# Patient Record
Sex: Female | Born: 1975 | Race: White | Hispanic: No | Marital: Married | Smoking: Never smoker
Health system: Southern US, Community
[De-identification: ages and names within clinical notes are randomized; demographics above are authoritative.]

## PROBLEM LIST (undated history)

## (undated) DIAGNOSIS — R002 Palpitations: Secondary | ICD-10-CM

## (undated) DIAGNOSIS — G43109 Migraine with aura, not intractable, without status migrainosus: Secondary | ICD-10-CM

## (undated) HISTORY — PX: NO PAST SURGERIES: SHX2092

---

## 2002-12-03 ENCOUNTER — Other Ambulatory Visit: Payer: Self-pay

## 2003-07-17 ENCOUNTER — Other Ambulatory Visit: Payer: Self-pay

## 2003-12-03 ENCOUNTER — Ambulatory Visit: Payer: Self-pay | Admitting: Anesthesiology

## 2004-06-20 ENCOUNTER — Ambulatory Visit (HOSPITAL_COMMUNITY): Admission: RE | Admit: 2004-06-20 | Discharge: 2004-06-20 | Payer: Self-pay | Admitting: Gynecology

## 2004-08-08 ENCOUNTER — Ambulatory Visit (HOSPITAL_COMMUNITY): Admission: RE | Admit: 2004-08-08 | Discharge: 2004-08-08 | Payer: Self-pay | Admitting: Gynecology

## 2004-10-10 ENCOUNTER — Ambulatory Visit (HOSPITAL_COMMUNITY): Admission: RE | Admit: 2004-10-10 | Discharge: 2004-10-10 | Payer: Self-pay | Admitting: Gynecology

## 2004-10-30 ENCOUNTER — Inpatient Hospital Stay (HOSPITAL_COMMUNITY): Admission: AD | Admit: 2004-10-30 | Discharge: 2004-11-01 | Payer: Self-pay | Admitting: Obstetrics & Gynecology

## 2005-05-04 ENCOUNTER — Emergency Department: Payer: Self-pay | Admitting: Emergency Medicine

## 2005-10-14 IMAGING — US US OB FOLLOW-UP
1 series · 13 of 28 positions shown · non-contrast
Comparison: none

CLINICAL DATA: 29-year-old.  G1 P0 with scan for growth.  Bilateral clubbed feet.

[Series 1: us ob follow-up · 0.33mm/px · 13 of 66 slices shown]
[im 3/66]
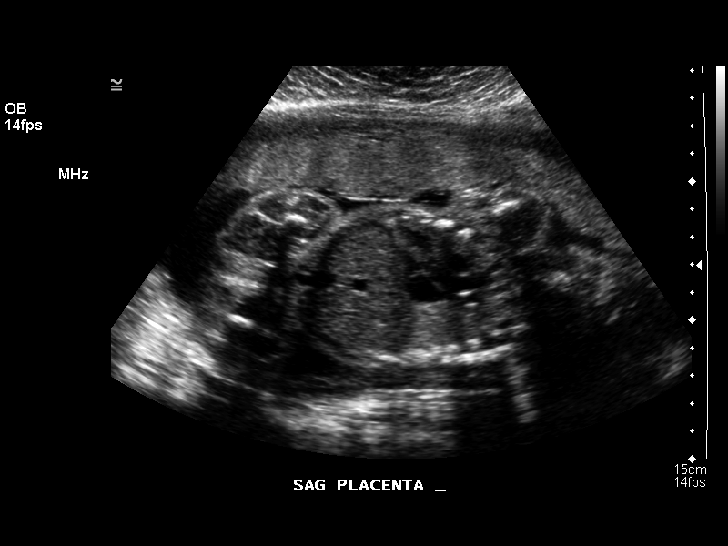
[im 8/66]
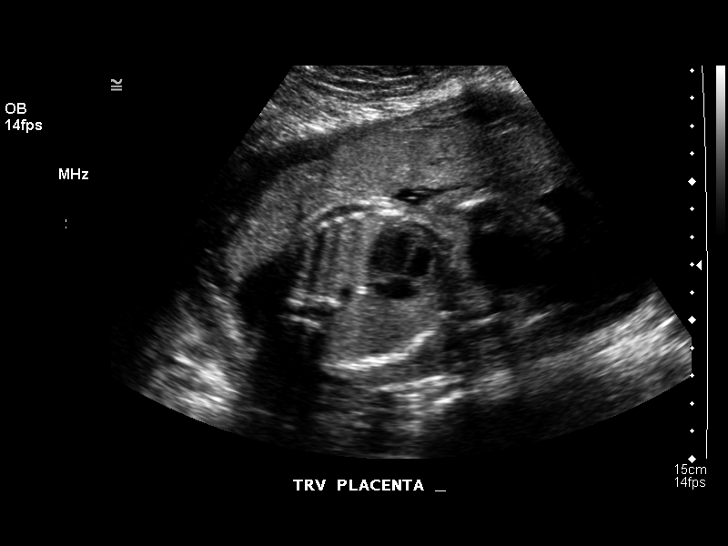
[im 13/66]
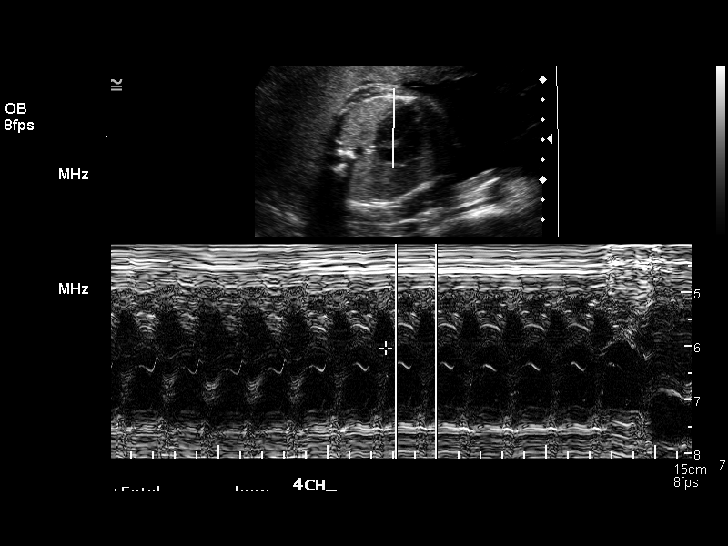
[im 17/66]
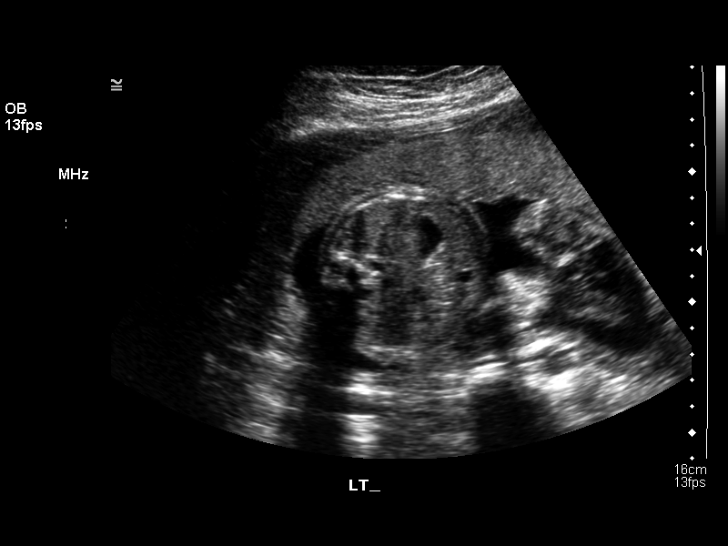
[im 22/66]
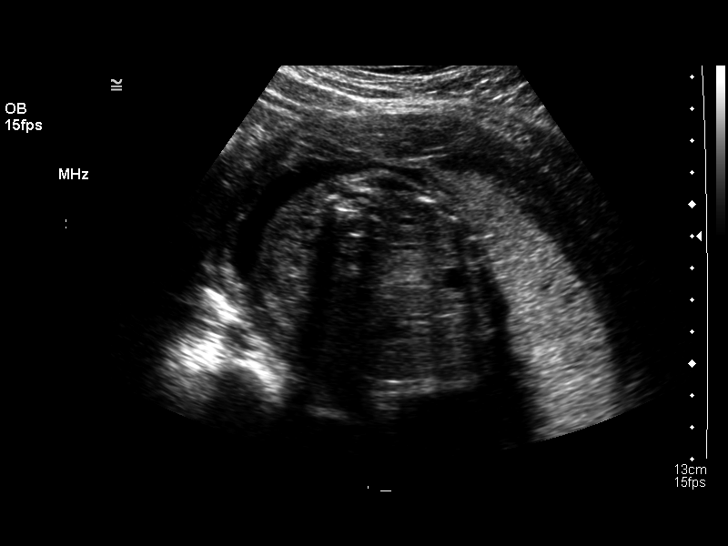
[im 27/66]
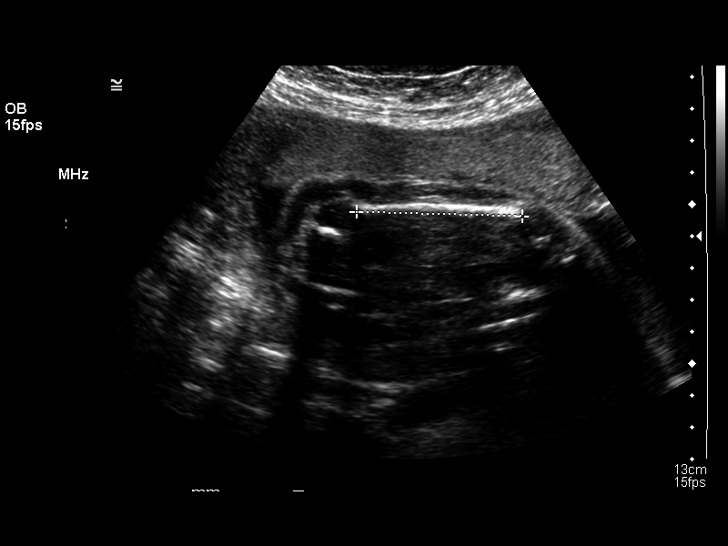
[im 34/66]
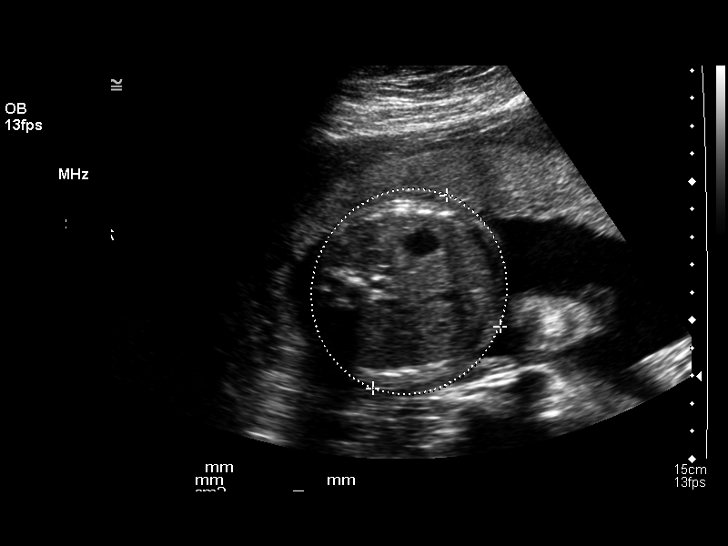
[im 39/66]
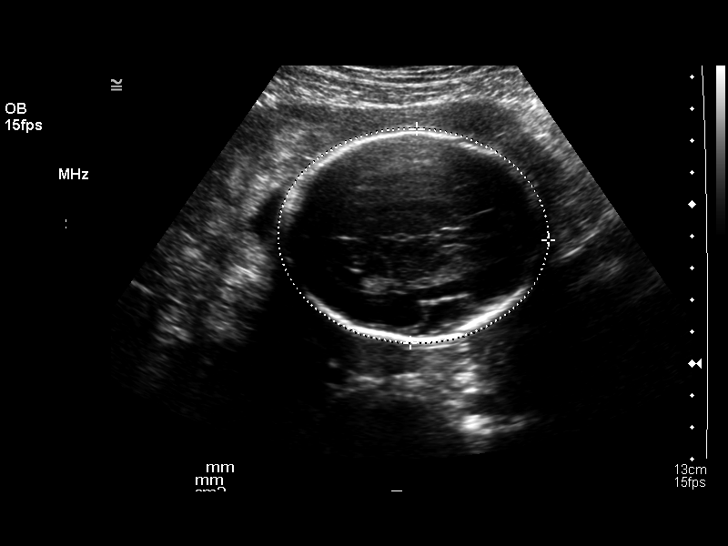
[im 44/66]
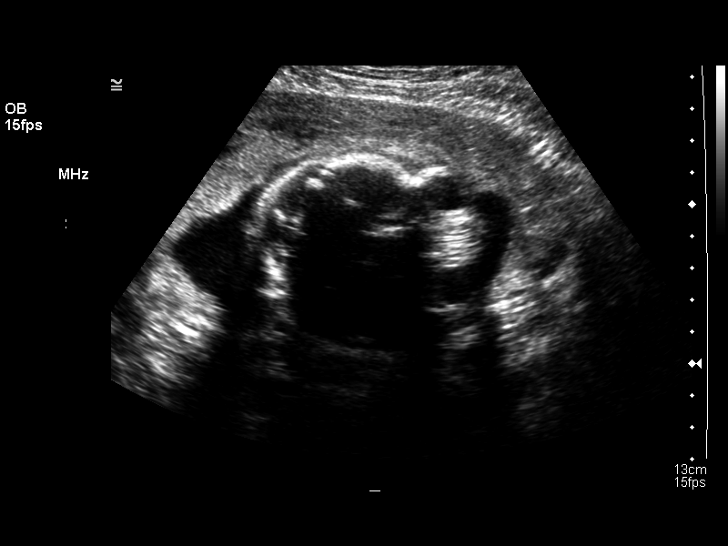
[im 49/66]
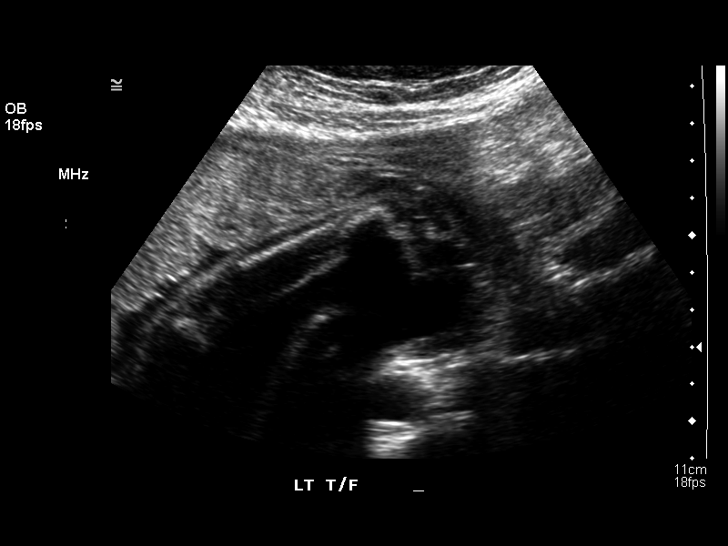
[im 53/66]
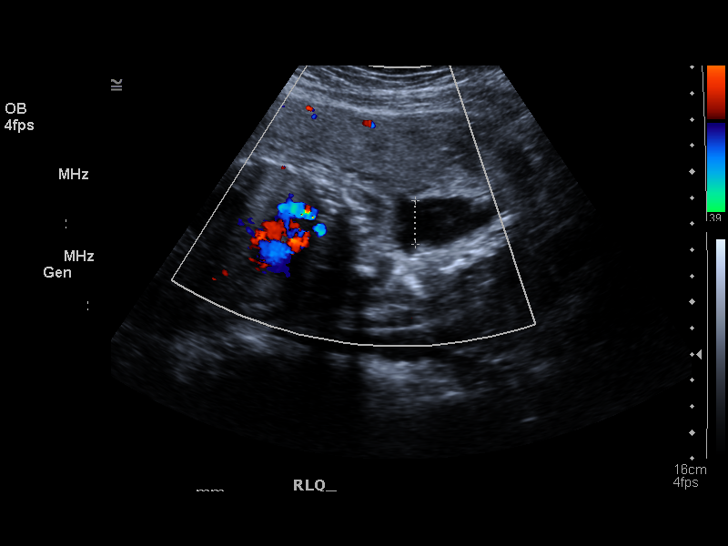
[im 58/66]
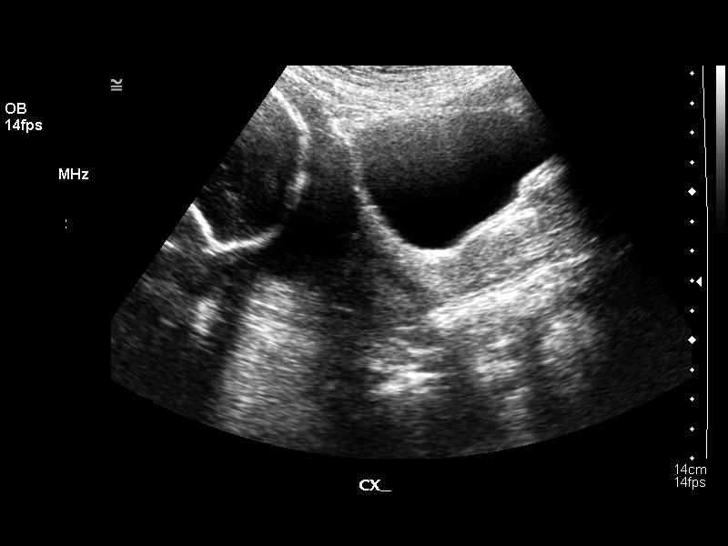
[im 63/66]
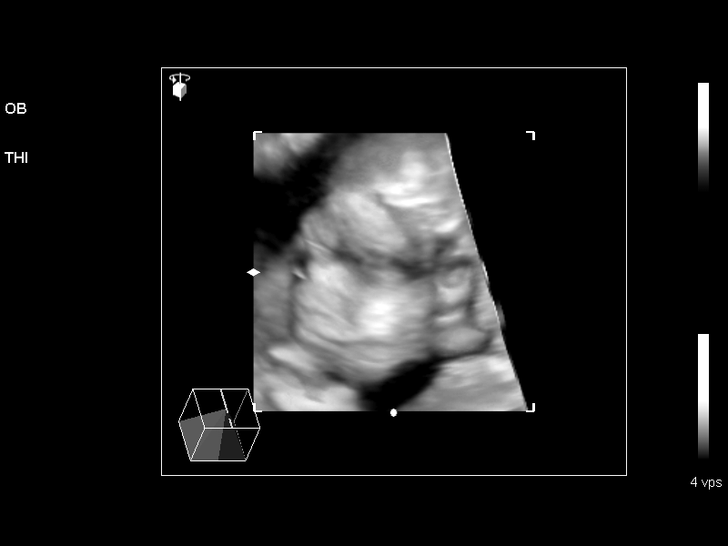

[13 of 28 positions shown; findings below may reference images not displayed]

OBSTETRICAL ULTRASOUND RE-EVALUATION:
 Number of Fetuses:  1
 Heart Rate:  153
 Movement:  Yes
 Breathing:  No
 Presentation:  Cephalic
 Placental Location:  Anterior, right lateral
 Grade:  I
 Previa:  No
 Amniotic Fluid (subjective):  Normal
 Amniotic Fluid (objective):  14.4 cm AFI (5th -95th%ile = 9.5 – 22.6 cm for 27 wks)

 FETAL BIOMETRY
 BPD:  6.6 cm   26 w 3 d
 HC:  24.2 cm   26 w 2 d
 AC:  22.1 cm   26 w 3 d
 FL:   5.2 cm   27 w 4 d

 Mean GA:  26 w 5 d
 Assigned GA:  26 w 4 d

 EFW:  990 g (H) 50th – 75th%ile (875 – 8983 g) For 27 wks

 FETAL ANATOMY
 Lateral Ventricles:  Visualized 
 Thalami/CSP:  Previously seen 
 Posterior Fossa:  Previously seen 
 Nuchal Region:  Previously seen 
 Spine:  Previously seen 
 4 Chamber Heart on Left:  Visualized 
 Stomach on Left:  Visualized 
 3 Vessel Cord:  Previously seen 
 Cord Insertion Site:  Previously seen 
 Kidneys:  Visualized 
 Bladder:  Visualized 
 Extremities:  Previously seen 
 Comment:  Left club foot is again documented.  Because of fetal position, the right lower extremity was difficult to evaluate.  

 MATERNAL UTERINE AND ADNEXAL FINDINGS
 Cervix:  3.4 cm Transabdominally
IMPRESSION: 1.  Single living intrauterine fetus in cephalic presentation. Appropriate interval growth and normal fluid.  
 2.  Left club foot again documented.

## 2006-02-03 ENCOUNTER — Ambulatory Visit: Payer: Self-pay | Admitting: Obstetrics & Gynecology

## 2006-02-03 ENCOUNTER — Encounter: Payer: Self-pay | Admitting: Obstetrics & Gynecology

## 2006-03-03 ENCOUNTER — Emergency Department: Payer: Self-pay | Admitting: Emergency Medicine

## 2006-03-04 ENCOUNTER — Other Ambulatory Visit: Payer: Self-pay

## 2006-09-16 ENCOUNTER — Ambulatory Visit: Payer: Self-pay | Admitting: Obstetrics & Gynecology

## 2006-09-22 ENCOUNTER — Ambulatory Visit: Payer: Self-pay | Admitting: Gynecology

## 2006-10-13 ENCOUNTER — Ambulatory Visit (HOSPITAL_COMMUNITY): Admission: RE | Admit: 2006-10-13 | Discharge: 2006-10-13 | Payer: Self-pay | Admitting: Obstetrics & Gynecology

## 2006-10-20 ENCOUNTER — Ambulatory Visit: Payer: Self-pay | Admitting: Obstetrics & Gynecology

## 2006-11-02 ENCOUNTER — Ambulatory Visit: Payer: Self-pay | Admitting: Gynecology

## 2007-12-19 IMAGING — US US OB COMP LESS 14 WK
1 series · 14 of 28 positions shown · non-contrast
Comparison: none

OBSTETRICAL ULTRASOUND:

 This ultrasound exam was performed in the [HOSPITAL] Ultrasound Department.  The OB US report was generated in the AS system, and faxed to the ordering physician.  This report is also available in [REDACTED] PACS.

[Series 1: us ob comp less 14 wk · 0.24mm/px · 14 of 35 slices shown]
[im 2/35]
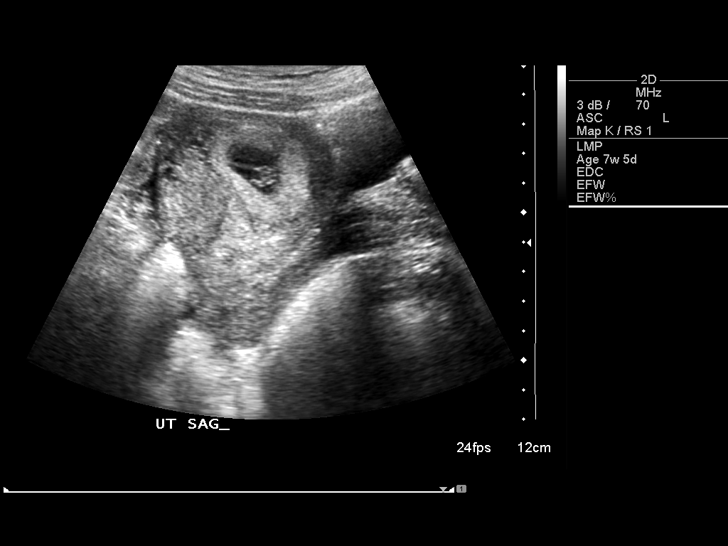
[im 4/35]
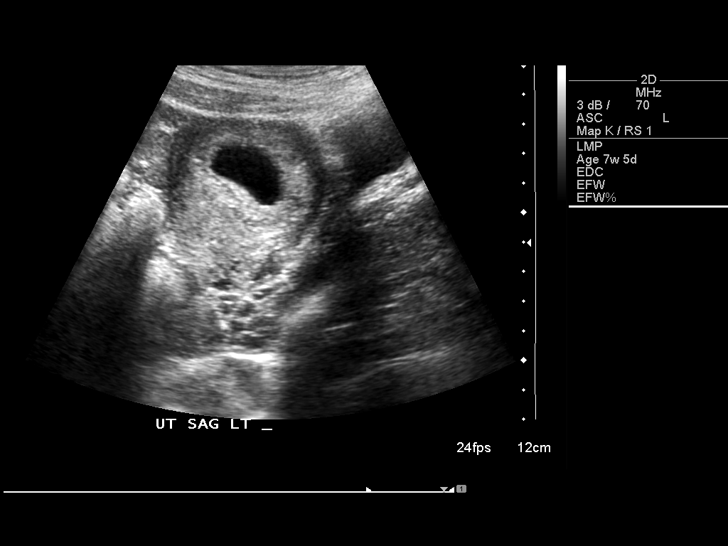
[im 7/35]
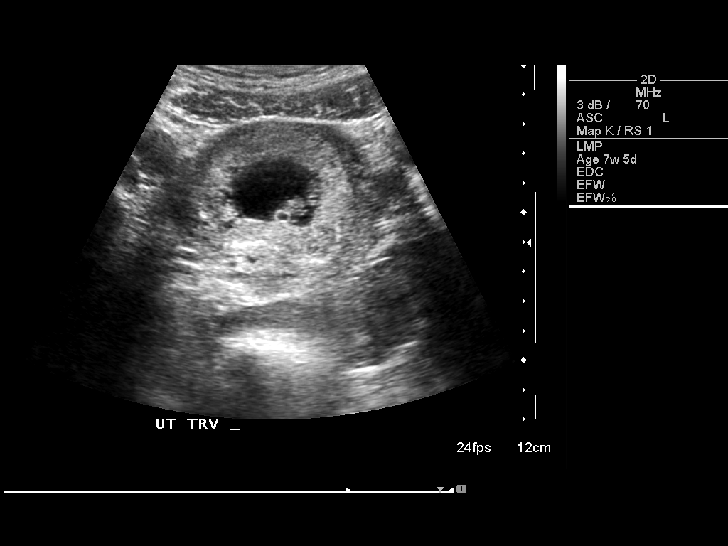
[im 9/35]
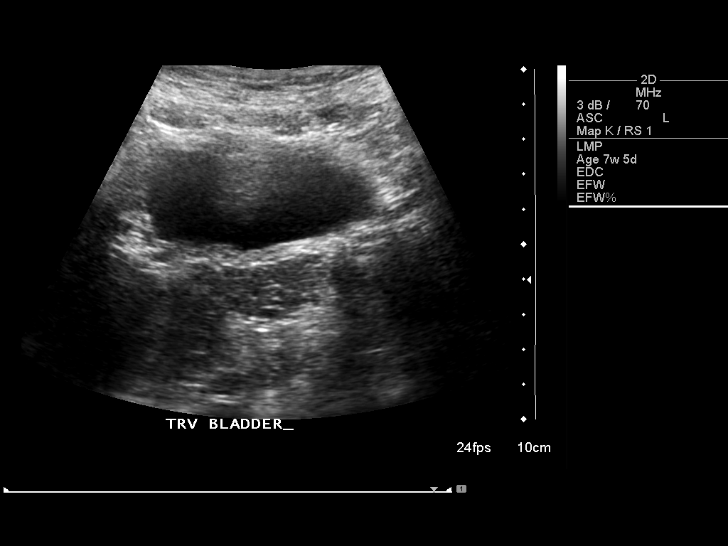
[im 12/35]
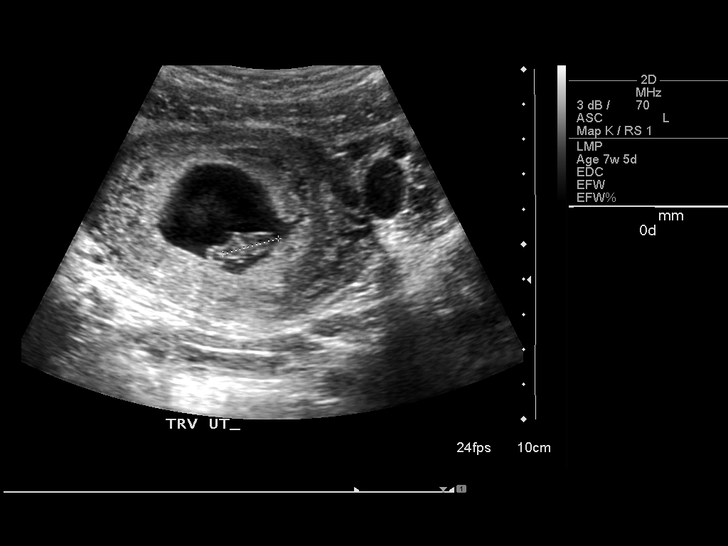
[im 14/35]
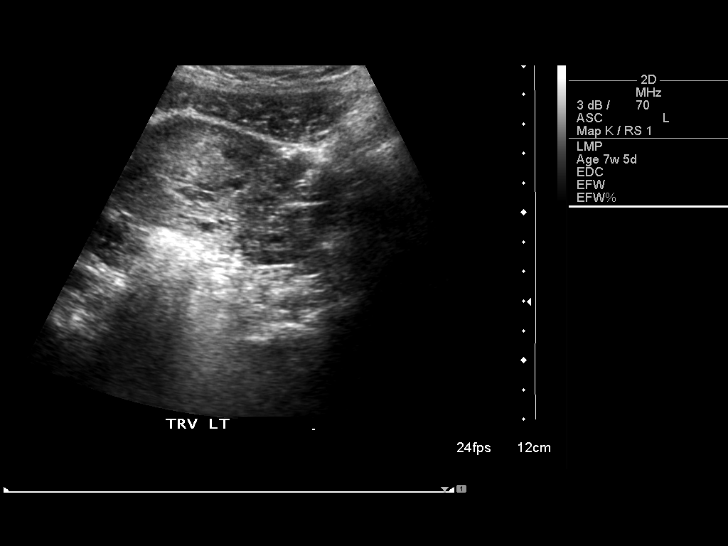
[im 17/35]
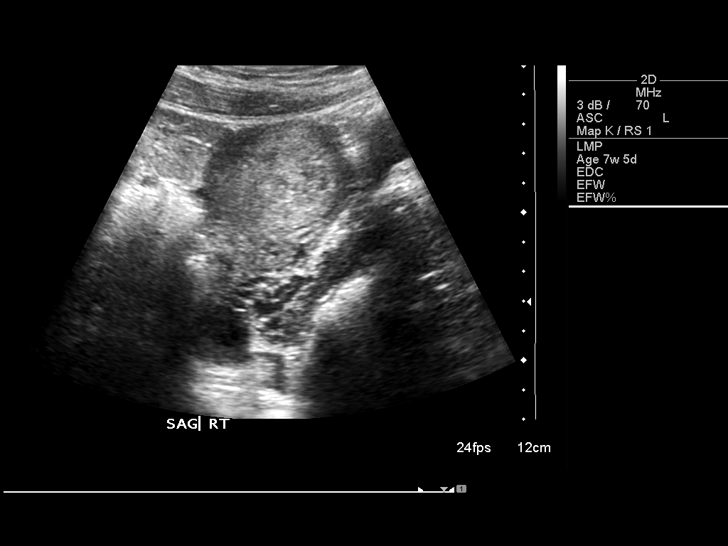
[im 19/35]
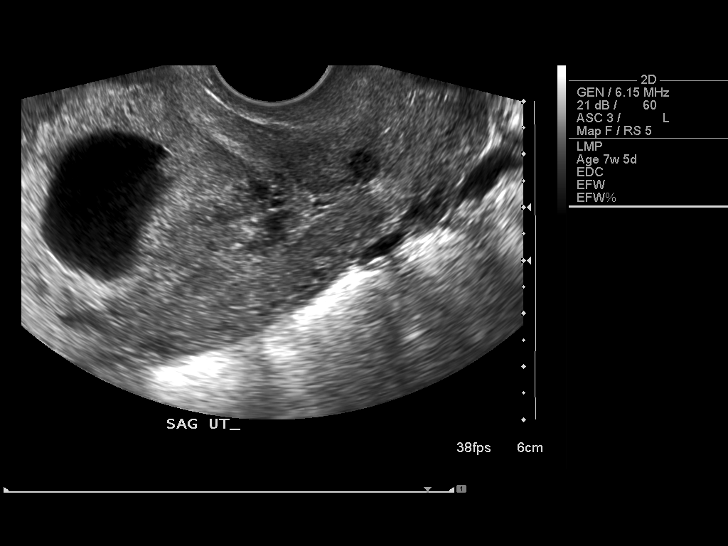
[im 22/35]
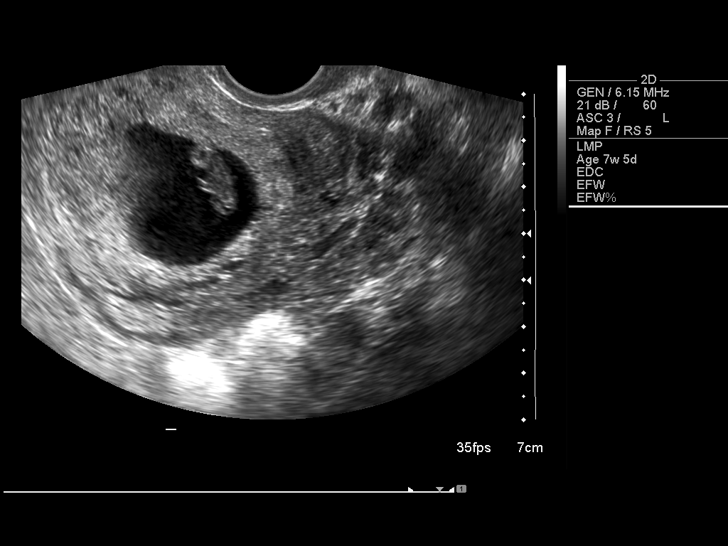
[im 24/35]
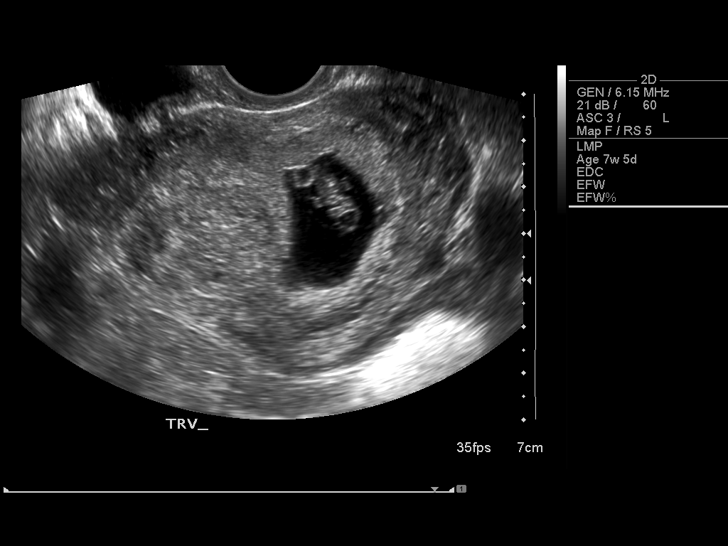
[im 27/35]
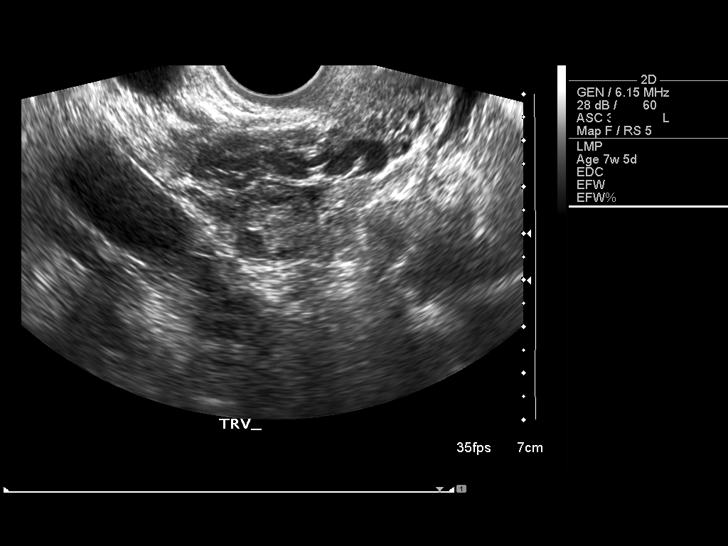
[im 29/35]
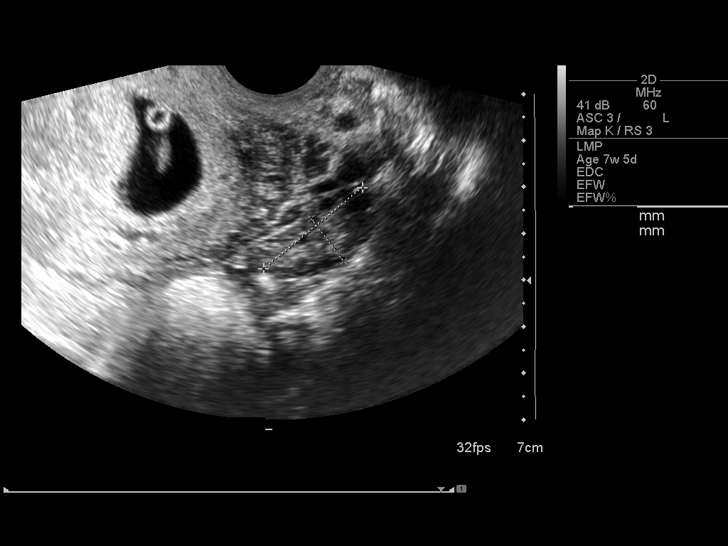
[im 32/35]
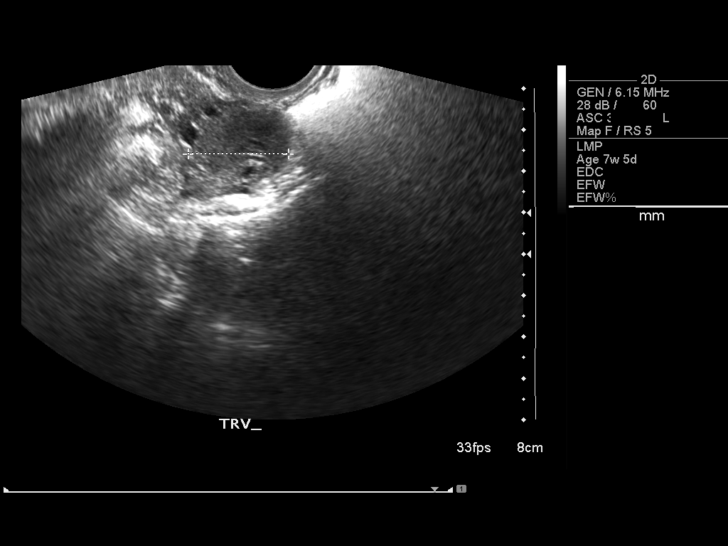
[im 35/35]
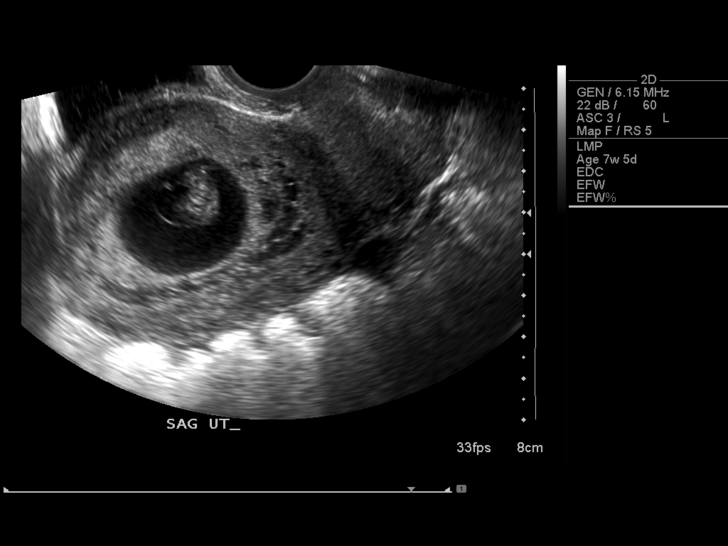

[14 of 28 positions shown; findings below may reference images not displayed]

IMPRESSION: See AS Obstetric US report.

## 2010-02-02 ENCOUNTER — Encounter: Payer: Self-pay | Admitting: Gynecology

## 2010-05-27 NOTE — Assessment & Plan Note (Signed)
[name], April Everett NO.:  [id number]   MEDICAL RECORD NO.:  [id number]         PATIENT TYPE:  POB   LOCATION:  [facility]         FACILITY:  [facility]   PHYSICIAN:  Allie Bossier, MD        DATE OF BIRTH:  07/26/1975   DATE OF SERVICE:                                  CLINIC NOTE   [facility] NOTE   HISTORY:  April Everett is a 35 year old, married, white, gravida III, para I,  abortus II, with a 2-month-old son.  She comes in for her annual exam.  She needs a pap smear today and wishes to discuss future pregnancy.  She  would like to become pregnant and has not used birth control in the last  15 months.  She is currently taking a multi-vitamin for folic acid  supplementation.  She is breast feeding two times a day and has not had  a period since her pregnancy.  The other __________ and variable with  regard to pregnancy is that she has had sex only approximately 3 times  since her delivery 15 months ago.  She does report a decrease sexual  drive.  Her other complaint today is that of a 2 year history (prior to  pregnancy) of floaters and occasional visual loss for the last two  years.  She has seen an ophthalmologist for this who has given her a  diagnosis of optic migraines.  Her other medical history is significant  for dysplastic nevi in the past.   PAST SURGICAL HISTORY:  Removal of skin lesions.   FAMILY HISTORY:  A brain tumor in a grandmother and coronary artery  disease.  She denies a family history of breast, GYN or colon  malignancy.   SOCIAL HISTORY:  She denies tobacco or hard drug use.   REVIEW OF SYSTEMS:  She has been married for three years and has  reported a new onset skin lesion of her right mons.   ALLERGIES:  No known drug allergies. No latex allergies.   PHYSICAL EXAMINATION:  Weight 126 pounds, blood pressure 119/73, pulse  73.  HEENT:  Normal.  HEART:  Regular rate and rhythm.  LUNGS: Clear to  auscultation bilaterally.   ABDOMEN:  Benign.  BREAST EXAM:  Normal.  EXTERNAL GENITALIA:  On her mons she has an approximately 7 mm raised  lesion in the right aspect of the mons.  The remainder of the vulva and  vagina are normal.  Cervix is normal.  Uterus is normal size and shape,  mobile and not enlarged.  Adnexa:  No tenderness.   ASSESSMENT AND PLAN:  1. Annual check PAP smear, encouraged self breast and self vulvar      exams monthly.  2. New onset of mons skin lesion.  We will schedule removal of this at      her convenience.  3. Amenorrhea.  Check __________ thyroid.  4. Positive family history of coronary artery disease.  Will check a      fasting lipid panel today.      Allie Bossier, MD     MCD/MEDQ  D:  02/03/2006  T:  02/03/2006  Job:  [id number] 

## 2015-11-07 ENCOUNTER — Ambulatory Visit
Admission: EM | Admit: 2015-11-07 | Discharge: 2015-11-07 | Disposition: A | Discharge status: Home / Self Care | Payer: Managed Care, Other (non HMO) | Attending: Family Medicine | Admitting: Family Medicine

## 2015-11-07 ENCOUNTER — Ambulatory Visit (INDEPENDENT_AMBULATORY_CARE_PROVIDER_SITE_OTHER): Payer: Managed Care, Other (non HMO)

## 2015-11-07 DIAGNOSIS — S92502A Displaced unspecified fracture of left lesser toe(s), initial encounter for closed fracture: Secondary | ICD-10-CM | POA: Diagnosis not present

## 2015-11-07 DIAGNOSIS — S9032XA Contusion of left foot, initial encounter: Secondary | ICD-10-CM | POA: Diagnosis not present

## 2015-11-07 NOTE — ED Provider Notes (Signed)
[facility] ____________________________________________  Time seen: Approximately 9:25 AM  I have reviewed the triage vital signs and the nursing notes.   HISTORY  Chief Complaint Foot Injury (Toes)  HPI Nikki Poirrier Tober is a 40 y.o. female presents for complaints of left foot pain since prior to arrival. States this morning she was exercising and went to put up her free weights, and in doing so she accidentally dropped a 5 pound weight on her left foot. States pain since. Reports unable to weight bear well due to pain in foot, but reports has applied some weight. Denies fall, head injury or loss of consciousness. Denies decreased sensation or pain radiation. Denies other pain.   States mild pain at this time, mostly with direct palpation or with attempted weightbearing. Patient denies any issues or previous injuries to left foot in the past. Denies any other pain or complaints.  Patient's last menstrual period was 10/18/2015 (exact date).Denies pregnancy.     History reviewed. No pertinent past medical history.  There are no active problems to display for this patient.   History reviewed. No pertinent surgical history.    No current facility-administered medications for this encounter.  No current outpatient prescriptions on file.  Allergies Erythromycin  History reviewed. No pertinent family history.  Social History Social History  Substance Use Topics  . Smoking status: Never Smoker  . Smokeless tobacco: Never Used  . Alcohol use No    Review of Systems Constitutional: No fever/chills Eyes: No visual changes. ENT: No sore throat. Cardiovascular: Denies chest pain. Respiratory: Denies shortness of breath. Gastrointestinal: No abdominal pain.  No nausea, no vomiting.  No diarrhea.  No constipation. Genitourinary: Negative for dysuria. Musculoskeletal: Negative for back pain. Skin: Negative for rash. Neurological: Negative for headaches, focal  weakness or numbness.  10-point ROS otherwise negative.  ____________________________________________   PHYSICAL EXAM:  VITAL SIGNS: ED Triage Vitals  Enc Vitals Group     BP 11/07/15 0921 118/79     Pulse Rate 11/07/15 0921 79     Resp 11/07/15 0921 18     Temp 11/07/15 0921 98 F (36.7 C)     Temp Source 11/07/15 0921 Oral     SpO2 11/07/15 0921 98 %     Weight 11/07/15 0921 130 lb (59 kg)     Height 11/07/15 0921 5\' 7"  (1.702 m)     Head Circumference --      Peak Flow --      Pain Score 11/07/15 0924 7     Pain Loc --      Pain Edu? --      Excl. in [place]? --     Constitutional: Alert and oriented. Well appearing and in no acute distress. Eyes: Conjunctivae are normal. PERRL. EOMI. ENT      Head: Normocephalic and atraumatic.      Mouth/Throat: Mucous membranes are moist. Cardiovascular: Normal rate, regular rhythm. Grossly normal heart sounds.  Good peripheral circulation. Respiratory: Normal respiratory effort without tachypnea nor retractions. Breath sounds are clear and equal bilaterally. No wheezes/rales/rhonchi.. Musculoskeletal: No midline cervical, thoracic or lumbar tenderness to palpation. Bilateral pedal pulses equal and easily palpated.  except: Left dorsal and plantar surface of foot along distal second through fourth metatarsals and proximal toes mild direct tenderness to palpation, moderate tenderness along distal third metatarsal, mild swelling and mild ecchymosis present, normal distal sensation and normal distal capillary refill, left lower extremity otherwise nontender. Gait not tested due to pain.  Neurologic:  Normal speech and language. No gross focal neurologic deficits are appreciated. Speech is normal. No gait instability.  Skin:  Skin is warm, dry and intact. No rash noted. Psychiatric: Mood and affect are normal. Speech and behavior are normal. Patient exhibits appropriate insight and judgment   ___________________________________________    LABS (all labs ordered are listed, but only abnormal results are displayed)  Labs Reviewed - No data to display ____________________________________________  RADIOLOGY  Dg Foot Complete Left  Result Date: 11/07/2015 CLINICAL DATA:  Dropped 5 pound weight onto left foot EXAM: LEFT FOOT - COMPLETE 3+ VIEW COMPARISON:  None. FINDINGS: Mildly displaced transverse fracture involving the distal shaft of the 3rd proximal phalanx. The joint spaces are preserved. Mild soft tissue swelling overlying the 5th MTP joint. IMPRESSION: Fracture involving the distal shaft of the 3rd proximal phalanx, as above. Electronically Signed   By: Julian Hy M.D.   On: 11/07/2015 10:01   ____________________________________________   PROCEDURES Procedures   Left second and third toes buddy taped, postoperative shoe applied. Crutches given. ________________________________________   INITIAL IMPRESSION / ASSESSMENT AND PLAN / ED COURSE  Pertinent labs & imaging results that were available during my care of the patient were reviewed by me and considered in my medical decision making (see chart for details).  Well appearing patient. No acute distress. Presents for complaints of left foot pain post mechanical injury. Left foot x-ray per radiologist's mildly displaced fracture involving the distal shaft of the third proximal phalanx. Discussed and reviewed x-ray with patient and family. Recommend podiatry follow-up as patient is very physically active and frequently exercises. Recommend follow-up in the next few days. Patient declines need for pain medication. Encouraged supportive care, rest, ice, elevation. Use of crutches and postoperative shoe and buddy taping. Information for podiatry given. Patient to call today to schedule follow-up.  Discussed follow up with Primary care physician this week. Discussed follow up and return parameters including no resolution or any worsening concerns. Patient verbalized  understanding and agreed to plan.   ____________________________________________   FINAL CLINICAL IMPRESSION(S) / ED DIAGNOSES  Final diagnoses:  Contusion of left foot, initial encounter  Closed fracture of phalanx of left third toe, initial encounter     There are no discharge medications for this patient.   Note: This dictation was prepared with Dragon dictation along with smaller phrase technology. Any transcriptional errors that result from this process are unintentional.    Clinical Course      Marylene Land, NP 11/07/15 1121

## 2015-11-07 NOTE — Discharge Instructions (Signed)
Take medication as prescribed. Rest. Elevate and apply ice.   Follow up with Podiatry this week.  Follow up with your primary care physician this week as needed. Return to Urgent care for new or worsening concerns.

## 2015-11-07 NOTE — ED Triage Notes (Signed)
Pt presents with a hurt foot, she was cleaning up this morning and dropped a weight 5lbs of her foot.

## 2015-11-10 ENCOUNTER — Telehealth: Payer: Self-pay | Admitting: *Deleted

## 2015-11-12 ENCOUNTER — Encounter: Payer: Self-pay | Admitting: *Deleted

## 2015-11-13 NOTE — Discharge Instructions (Signed)
[facility] °[facility] ° °POST OPERATIVE INSTRUCTIONS FOR DR. TROXLER AND DR. FOWLER °[facility] ° ° °1. Take your medication as prescribed.  Pain medication should be taken only as needed. ° °2. Keep the dressing clean, dry and intact. ° °3. Keep your foot elevated above the heart level for the first 48 hours. ° °4. Walking to the bathroom and brief periods of walking are acceptable, unless we have instructed you to be non-weight bearing. ° °5. Always wear your post-op shoe when walking.  Always use your crutches if you are to be non-weight bearing. ° °6. Do not take a shower. Baths are permissible as long as the foot is kept out of the water.  ° °7. Every hour you are awake:  °- Bend your knee 15 times. °- Flex foot 15 times °- Massage calf 15 times ° °8. Call [facility] ([phone]) if any of the following problems occur: °- You develop a temperature or fever. °- The bandage becomes saturated with blood. °- Medication does not stop your pain. °- Injury of the foot occurs. °- Any symptoms of infection including redness, odor, or red streaks running from wound. ° °General Anesthesia, Adult, Care After °Refer to this sheet in the next few weeks. These instructions provide you with information on caring for yourself after your procedure. Your health care provider may also give you more specific instructions. Your treatment has been planned according to current medical practices, but problems sometimes occur. Call your health care provider if you have any problems or questions after your procedure. °WHAT TO EXPECT AFTER THE PROCEDURE °After the procedure, it is typical to experience: °· Sleepiness. °· Nausea and vomiting. °HOME CARE INSTRUCTIONS °· For the first 24 hours after general anesthesia: °¨ Have a responsible person with you. °¨ Do not drive a car. If you are alone, do not take public transportation. °¨ Do not drink alcohol. °¨ Do not take  medicine that has not been prescribed by your health care provider. °¨ Do not sign important papers or make important decisions. °¨ You may resume a normal diet and activities as directed by your health care provider. °· Change bandages (dressings) as directed. °· If you have questions or problems that seem related to general anesthesia, call the hospital and ask for the anesthetist or anesthesiologist on call. °SEEK MEDICAL CARE IF: °· You have nausea and vomiting that continue the day after anesthesia. °· You develop a rash. °SEEK IMMEDIATE MEDICAL CARE IF:  °· You have difficulty breathing. °· You have chest pain. °· You have any allergic problems. °  °This information is not intended to replace advice given to you by your health care provider. Make sure you discuss any questions you have with your health care provider. °  °Document Released: 04/06/2000 Document Revised: 01/19/2014 Document Reviewed: 04/29/2011 °Elsevier Interactive Patient Education ©2016 [employer] ° °

## 2015-11-14 ENCOUNTER — Ambulatory Visit: Payer: Managed Care, Other (non HMO) | Admitting: Anesthesiology

## 2015-11-14 ENCOUNTER — Encounter
Admission: RE | Disposition: A | Discharge status: Home / Self Care | Payer: Self-pay | Source: Ambulatory Visit | Attending: Podiatry

## 2015-11-14 ENCOUNTER — Ambulatory Visit
Admission: RE | Admit: 2015-11-14 | Discharge: 2015-11-14 | Disposition: A | Discharge status: Home / Self Care | Payer: Managed Care, Other (non HMO) | Source: Ambulatory Visit | Attending: Podiatry | Admitting: Podiatry

## 2015-11-14 DIAGNOSIS — S92512A Displaced fracture of proximal phalanx of left lesser toe(s), initial encounter for closed fracture: Secondary | ICD-10-CM | POA: Diagnosis present

## 2015-11-14 DIAGNOSIS — X58XXXA Exposure to other specified factors, initial encounter: Secondary | ICD-10-CM | POA: Insufficient documentation

## 2015-11-14 HISTORY — DX: Palpitations: R00.2

## 2015-11-14 HISTORY — DX: Migraine with aura, not intractable, without status migrainosus: G43.109

## 2015-11-14 HISTORY — PX: CLOSED REDUCTION METATARSAL: SHX5774

## 2015-11-14 SURGERY — CLOSED REDUCTION, FRACTURE, METATARSAL BONE
Anesthesia: Monitor Anesthesia Care | Site: Third Toe | Laterality: Left | Wound class: Clean

## 2015-11-14 MED ORDER — ONDANSETRON HCL 4 MG/2ML IJ SOLN: 4.0000 mg | Freq: Once | INTRAMUSCULAR | Status: DC | PRN

## 2015-11-14 MED ORDER — OXYCODONE HCL 5 MG/5ML PO SOLN
5.0000 mg | Freq: Once | ORAL | Status: AC | PRN
Start: 2015-11-14 — End: 2015-11-14

## 2015-11-14 MED ORDER — FENTANYL CITRATE (PF) 100 MCG/2ML IJ SOLN
25.0000 ug | INTRAMUSCULAR | Status: DC | PRN
  Administered 2015-11-14: 25 ug via INTRAVENOUS

## 2015-11-14 MED ORDER — BUPIVACAINE HCL (PF) 0.5 % IJ SOLN
INTRAMUSCULAR | Status: DC | PRN
  Administered 2015-11-14: 10 mL

## 2015-11-14 MED ORDER — OXYCODONE HCL 5 MG PO TABS
5.0000 mg | ORAL_TABLET | Freq: Once | ORAL | Status: AC | PRN
  Administered 2015-11-14: 5 mg via ORAL

## 2015-11-14 MED ORDER — ACETAMINOPHEN 325 MG PO TABS: 325.0000 mg | ORAL_TABLET | ORAL | Status: DC | PRN

## 2015-11-14 MED ORDER — HYDROCODONE-ACETAMINOPHEN 5-325 MG PO TABS: 1.0000 | ORAL_TABLET | Freq: Four times a day (QID) | ORAL | 0 refills | Status: AC | PRN

## 2015-11-14 MED ORDER — PROPOFOL 500 MG/50ML IV EMUL
INTRAVENOUS | Status: DC | PRN
  Administered 2015-11-14: 100 ug/kg/min via INTRAVENOUS

## 2015-11-14 MED ORDER — CEFAZOLIN SODIUM-DEXTROSE 2-4 GM/100ML-% IV SOLN
2.0000 g | Freq: Once | INTRAVENOUS | Status: AC
  Administered 2015-11-14: 2 g via INTRAVENOUS

## 2015-11-14 MED ORDER — LACTATED RINGERS IV SOLN
INTRAVENOUS | Status: DC
  Administered 2015-11-14: 07:00:00 via INTRAVENOUS

## 2015-11-14 MED ORDER — MIDAZOLAM HCL 2 MG/2ML IJ SOLN
INTRAMUSCULAR | Status: DC | PRN
  Administered 2015-11-14: 2 mg via INTRAVENOUS

## 2015-11-14 MED ORDER — FENTANYL CITRATE (PF) 100 MCG/2ML IJ SOLN
INTRAMUSCULAR | Status: DC | PRN
  Administered 2015-11-14 (×2): 50 ug via INTRAVENOUS

## 2015-11-14 MED ORDER — LIDOCAINE HCL (CARDIAC) 20 MG/ML IV SOLN
INTRAVENOUS | Status: DC | PRN
Start: 2015-11-14 — End: 2015-11-14
  Administered 2015-11-14: 50 mg via INTRAVENOUS

## 2015-11-14 MED ORDER — ACETAMINOPHEN 160 MG/5ML PO SOLN
325.0000 mg | ORAL | Status: DC | PRN
Start: 2015-11-14 — End: 2015-11-14

## 2015-11-14 SURGICAL SUPPLY — 49 items
APL SKNCLS STERI-STRIP NONHPOA (GAUZE/BANDAGES/DRESSINGS)
BANDAGE ELASTIC 4 VELCRO NS (GAUZE/BANDAGES/DRESSINGS) ×4 IMPLANT
BENZOIN TINCTURE PRP APPL 2/3 (GAUZE/BANDAGES/DRESSINGS) ×1 IMPLANT
BLADE MINI RND TIP GREEN BEAV (BLADE) IMPLANT
BNDG CMPR 75X41 PLY HI ABS (GAUZE/BANDAGES/DRESSINGS) ×2
BNDG ESMARK 4X12 TAN STRL LF (GAUZE/BANDAGES/DRESSINGS) ×4 IMPLANT
BNDG GAUZE 4.5X4.1 6PLY STRL (MISCELLANEOUS) ×4 IMPLANT
BNDG STRETCH 4X75 STRL LF (GAUZE/BANDAGES/DRESSINGS) ×4 IMPLANT
CANISTER SUCT 1200ML W/VALVE (MISCELLANEOUS) ×4 IMPLANT
CLOSURE WOUND 1/4X4 (GAUZE/BANDAGES/DRESSINGS)
COVER LIGHT HANDLE UNIVERSAL (MISCELLANEOUS) ×8 IMPLANT
COVER PIN YLW 0.028-062 (MISCELLANEOUS) ×3 IMPLANT
CUFF TOURN SGL QUICK 18 (TOURNIQUET CUFF) ×3 IMPLANT
DRAPE FLUOR MINI C-ARM 54X84 (DRAPES) ×4 IMPLANT
DURAPREP 26ML APPLICATOR (WOUND CARE) ×4 IMPLANT
GAUZE PETRO XEROFOAM 1X8 (MISCELLANEOUS) ×4 IMPLANT
GAUZE SPONGE 4X4 12PLY STRL (GAUZE/BANDAGES/DRESSINGS) ×4 IMPLANT
GLOVE BIO SURGEON STRL SZ8 (GLOVE) ×10 IMPLANT
GOWN STRL REUS W/ TWL LRG LVL3 (GOWN DISPOSABLE) ×2 IMPLANT
GOWN STRL REUS W/ TWL XL LVL3 (GOWN DISPOSABLE) ×2 IMPLANT
GOWN STRL REUS W/TWL LRG LVL3 (GOWN DISPOSABLE) ×4
GOWN STRL REUS W/TWL XL LVL3 (GOWN DISPOSABLE) ×4
K-WIRE 0.045 (Wire) ×3 IMPLANT
K-WIRE DBL END TROCAR 6X.045 (WIRE) ×4
K-WIRE DBL END TROCAR 6X.062 (WIRE)
KIT ROOM TURNOVER OR (KITS) ×4 IMPLANT
KWIRE DBL END TROCAR 6X.045 (WIRE) ×1 IMPLANT
KWIRE DBL END TROCAR 6X.062 (WIRE) IMPLANT
NDL HYPO 25GX1X1/2 BEV (NEEDLE) IMPLANT
NEEDLE HYPO 25GX1X1/2 BEV (NEEDLE) ×4 IMPLANT
NS IRRIG 500ML POUR BTL (IV SOLUTION) ×4 IMPLANT
PACK EXTREMITY ARMC (MISCELLANEOUS) ×4 IMPLANT
PAD GROUND ADULT SPLIT (MISCELLANEOUS) ×4 IMPLANT
PADDING CAST 4IN STRL (MISCELLANEOUS) ×4
PADDING CAST BLEND 3X4 NS (MISCELLANEOUS) ×2 IMPLANT
PADDING CAST BLEND 3X4YD NS (MISCELLANEOUS) ×4
PADDING CAST BLEND 4X4 STRL (MISCELLANEOUS) ×4 IMPLANT
SPLINT CAST 1 STEP 4X30 (MISCELLANEOUS) ×3 IMPLANT
SPLINT FAST PLASTER 5X30 (CAST SUPPLIES)
SPLINT PLASTER CAST FAST 5X30 (CAST SUPPLIES) ×1 IMPLANT
STOCKINETTE STRL 6IN 960660 (GAUZE/BANDAGES/DRESSINGS) ×4 IMPLANT
STRAP BODY AND KNEE 60X3 (MISCELLANEOUS) ×4 IMPLANT
STRIP CLOSURE SKIN 1/4X4 (GAUZE/BANDAGES/DRESSINGS) ×1 IMPLANT
SUT ETHILON 4-0 (SUTURE)
SUT ETHILON 4-0 FS2 18XMFL BLK (SUTURE)
SUT VIC AB 4-0 FS2 27 (SUTURE) ×1 IMPLANT
SUT VICRYL AB 3-0 FS1 BRD 27IN (SUTURE) IMPLANT
SUTURE ETHLN 4-0 FS2 18XMF BLK (SUTURE) IMPLANT
SYRINGE 10CC LL (SYRINGE) ×3 IMPLANT

## 2015-11-14 NOTE — Anesthesia Postprocedure Evaluation (Signed)
Anesthesia Post Note  Patient: April Everett  Procedure(s) Performed: Procedure(s) (LRB): CLOSED REDUCTION WITH PINNING OF LEFT THIRD TOE (Left)  Patient location during evaluation: PACU Anesthesia Type: MAC Level of consciousness: awake and alert and oriented Pain management: satisfactory to patient Vital Signs Assessment: post-procedure vital signs reviewed and stable Respiratory status: spontaneous breathing, nonlabored ventilation and respiratory function stable Cardiovascular status: blood pressure returned to baseline and stable Postop Assessment: Adequate PO intake and No signs of nausea or vomiting Anesthetic complications: no    Raliegh Ip

## 2015-11-14 NOTE — Op Note (Signed)
Operative note   Surgeon: Dr. Albertine Patricia, DPM.    Assistant: None    Preop diagnosis: Displaced fracture proximal phalanx third toe left foot    Postop diagnosis: Same    Procedure:   1. Closed reduction with internal fixation third toe proximal phalanx fracture left foot          EBL: Less than 5 cc    Anesthesia:IV sedation delivered by anesthesia team and Marcaine plain block delivered by me consisting of 4 cc of 0.5% Marcaine plain preoperatively.    Hemostasis: None    Specimen: None    Complications: None    Operative indications: Displaced proximal phalanx fracture third toe left foot needing pinning for long-term stabilization and maintenance of corrected position.    Procedure:  Patient was brought into the OR and placed on the operating table in thesupine position. After anesthesia was obtained theleft lower extremity was prepped and draped in usual sterile fashion.  Operative Report: This time attention was directed third toe of the left foot. The fracture fragment was located at the proximal phalanx and was mostly a dorsal displacement and rotation. The toe was grasped initially pulled dorsally and then rotated distally and the area was checked this time and good reduction of the rotational sagittal plane deformity was noted to have been improved considerably. This point there is checked FluoroScan good position of the fracture fragment was noted. This time I elected to use a percutaneous 0.5 K wire drilled through the distal and middle foot phalanx and then run across the PIP joint into the proximal phalanx head and down the shaft of the third toe. Once this was accomplished there is checked FluoroScan good position and correction were noted in all 3 planes additional comminution of fracture was noted at the base of the middle phalanx which is additional injury to her original injury that was not picked up on original x-rays. This was nondisplaced as well. Good  reduction and position of the toe and the fragment were noted. Good alignment of the phalanx head distal fragment fracture with the body of the phalanx. A sterile compressive dressing was placed across went this time consisting of 4 x 4's, performed Kerlix and a posterior splint was placed on the left foot leg in the operating room.    Patient tolerated the procedure and anesthesia well.  Was transported from the OR to the PACU with all vital signs stable and vascular status intact. To be discharged per routine protocol.  Will follow up in approximately 1 week in the outpatient clinic.

## 2015-11-14 NOTE — H&P (Signed)
H and P has been reviewed and no changes are noted.  

## 2015-11-14 NOTE — Anesthesia Procedure Notes (Signed)
Procedure Name: MAC Performed by: Rayvon Dakin Pre-anesthesia Checklist: Patient identified, Emergency Drugs available, Suction available, Timeout performed and Patient being monitored Patient Re-evaluated:Patient Re-evaluated prior to inductionOxygen Delivery Method: Simple face mask Placement Confirmation: positive ETCO2       

## 2015-11-14 NOTE — Anesthesia Preprocedure Evaluation (Signed)
Anesthesia Evaluation  Patient identified by MRN, date of birth, ID band Patient awake    Reviewed: Allergy & Precautions, H&P , NPO status , Patient's Chart, lab work & pertinent test results  Airway Mallampati: I  TM Distance: >3 FB Neck ROM: full    Dental no notable dental hx.    Pulmonary    Pulmonary exam normal        Cardiovascular Normal cardiovascular exam     Neuro/Psych    GI/Hepatic   Endo/Other    Renal/GU      Musculoskeletal   Abdominal   Peds  Hematology   Anesthesia Other Findings   Reproductive/Obstetrics                             Anesthesia Physical Anesthesia Plan  ASA: I  Anesthesia Plan: MAC   Post-op Pain Management:    Induction:   Airway Management Planned:   Additional Equipment:   Intra-op Plan:   Post-operative Plan:   Informed Consent: I have reviewed the patients History and Physical, chart, labs and discussed the procedure including the risks, benefits and alternatives for the proposed anesthesia with the patient or authorized representative who has indicated his/her understanding and acceptance.     Plan Discussed with:   Anesthesia Plan Comments:         Anesthesia Quick Evaluation

## 2015-11-14 NOTE — Transfer of Care (Signed)
Immediate Anesthesia Transfer of Care Note  Patient: April Everett  Procedure(s) Performed: Procedure(s): OPEN REDUCTION INTERNAL FIXATION (ORIF)  LEFT 3RD TOE (Left)  Patient Location: PACU  Anesthesia Type: MAC  Level of Consciousness: awake, alert  and patient cooperative  Airway and Oxygen Therapy: Patient Spontanous Breathing and Patient connected to supplemental oxygen  Post-op Assessment: Post-op Vital signs reviewed, Patient's Cardiovascular Status Stable, Respiratory Function Stable, Patent Airway and No signs of Nausea or vomiting  Post-op Vital Signs: Reviewed and stable  Complications: No apparent anesthesia complications

## 2017-01-12 DIAGNOSIS — D229 Melanocytic nevi, unspecified: Secondary | ICD-10-CM

## 2017-01-12 HISTORY — DX: Melanocytic nevi, unspecified: D22.9

## 2017-01-12 IMAGING — CR DG FOOT COMPLETE 3+V*L*
4 series · 4 of 4 positions shown · non-contrast
Comparison: None.

CLINICAL DATA: Dropped 5 pound weight onto left foot

EXAM:
LEFT FOOT - COMPLETE 3+ VIEW

[foot ap]
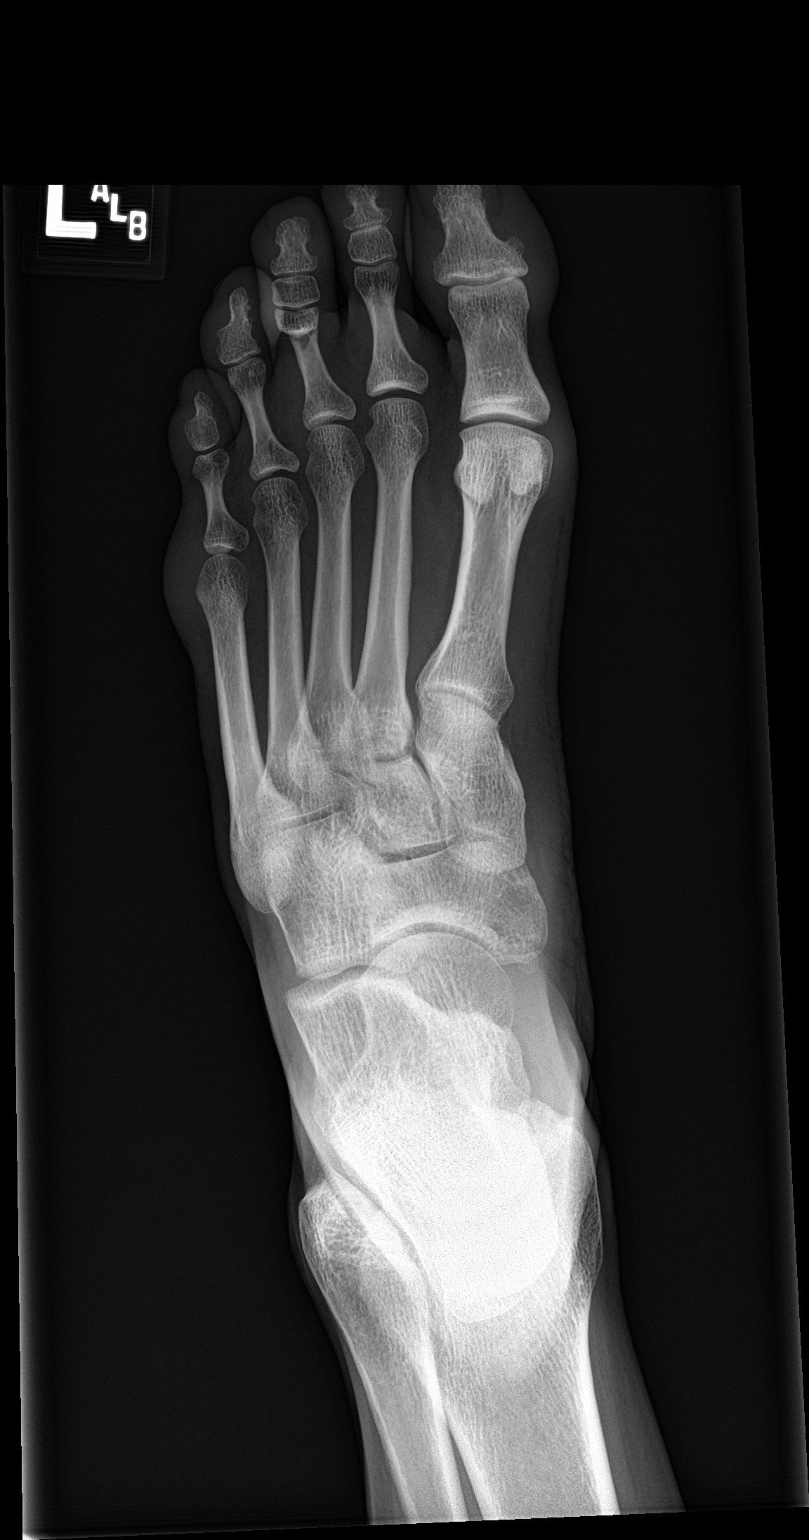

[foot obl]
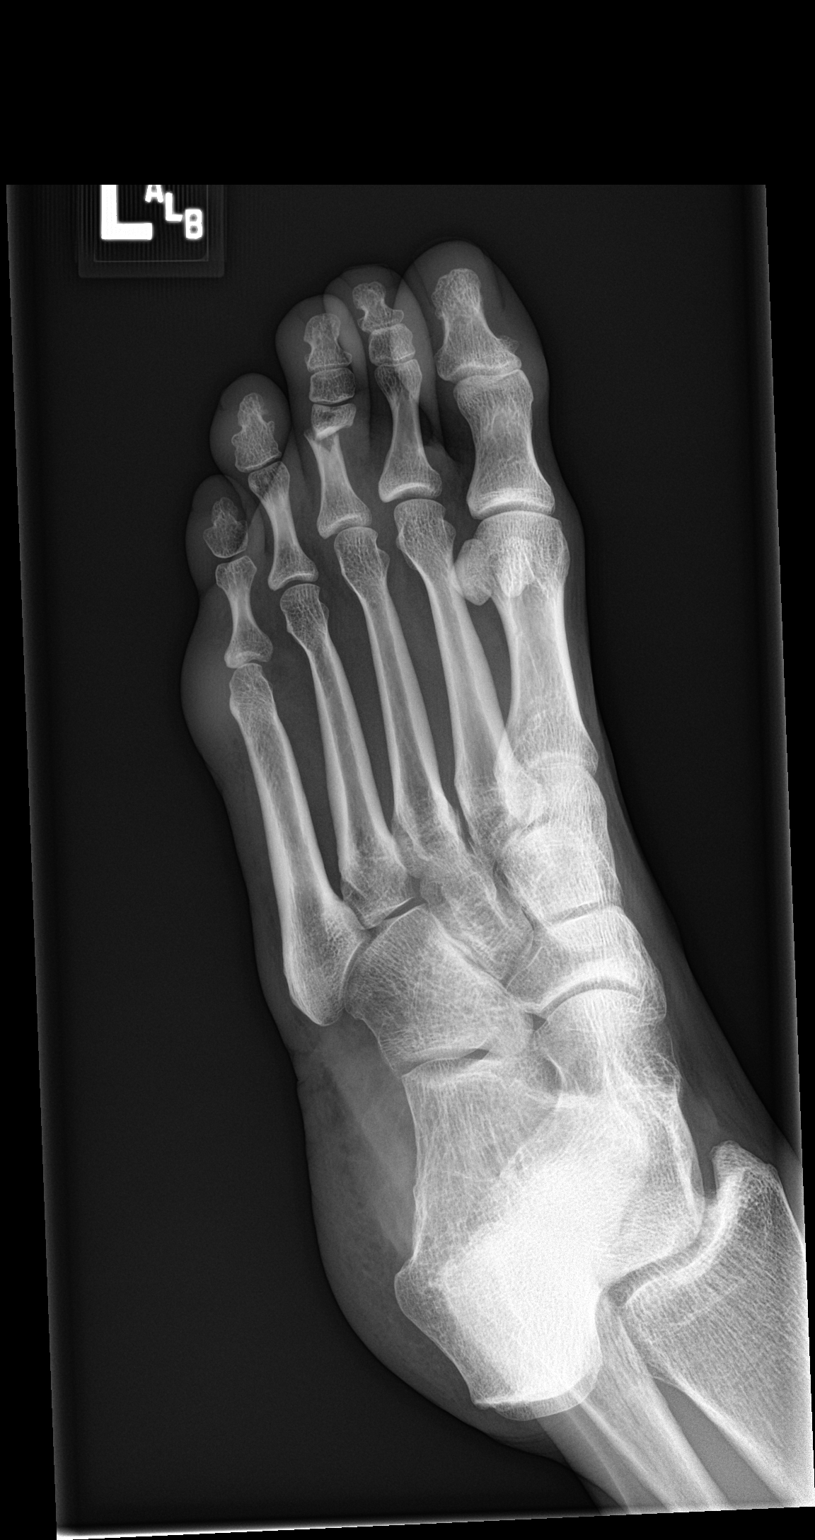

[foot lat (1 of 2)]
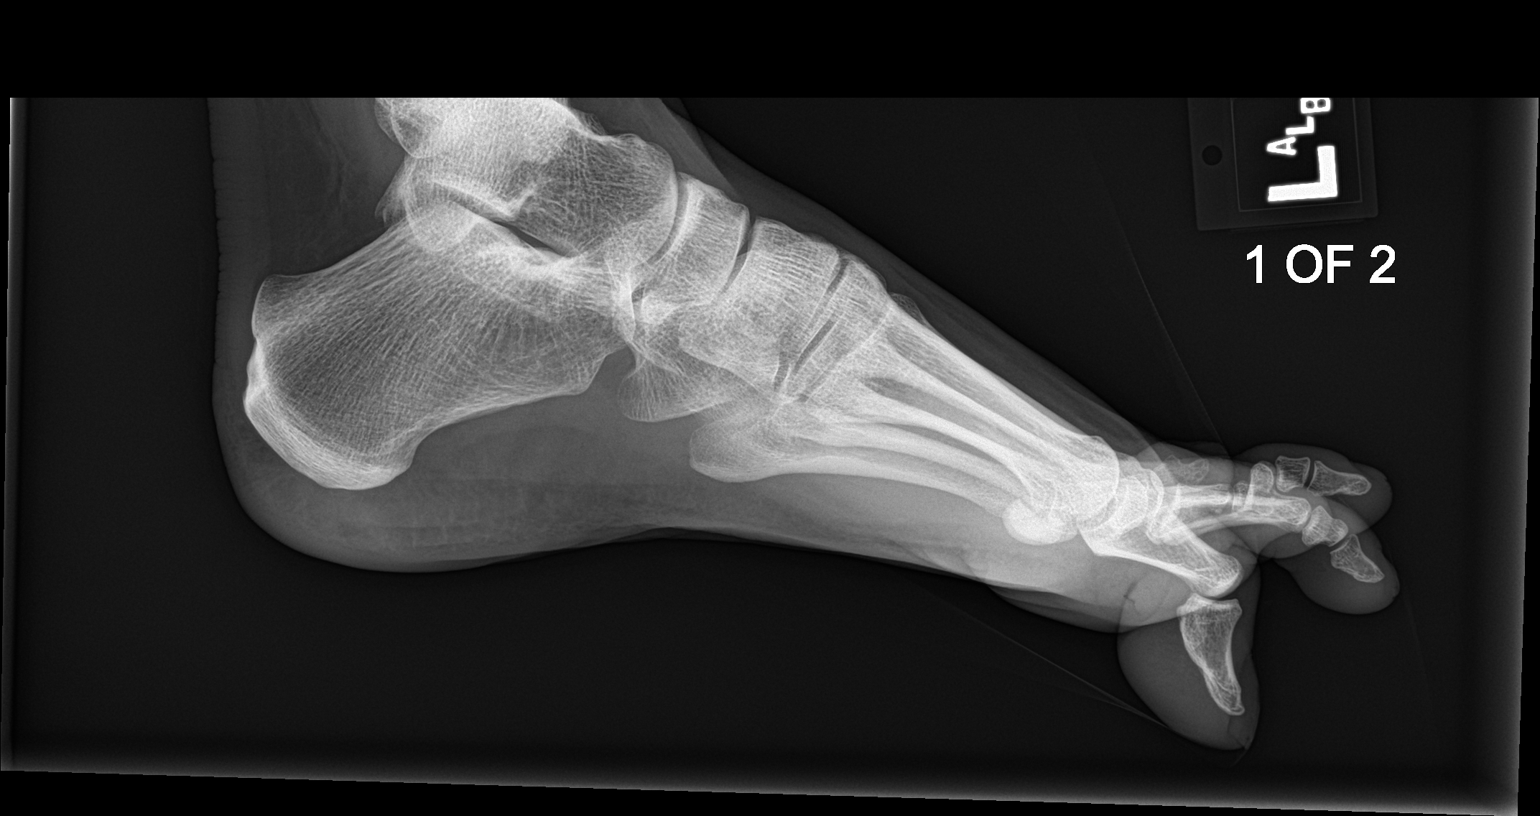

[foot lat (2 of 2)]
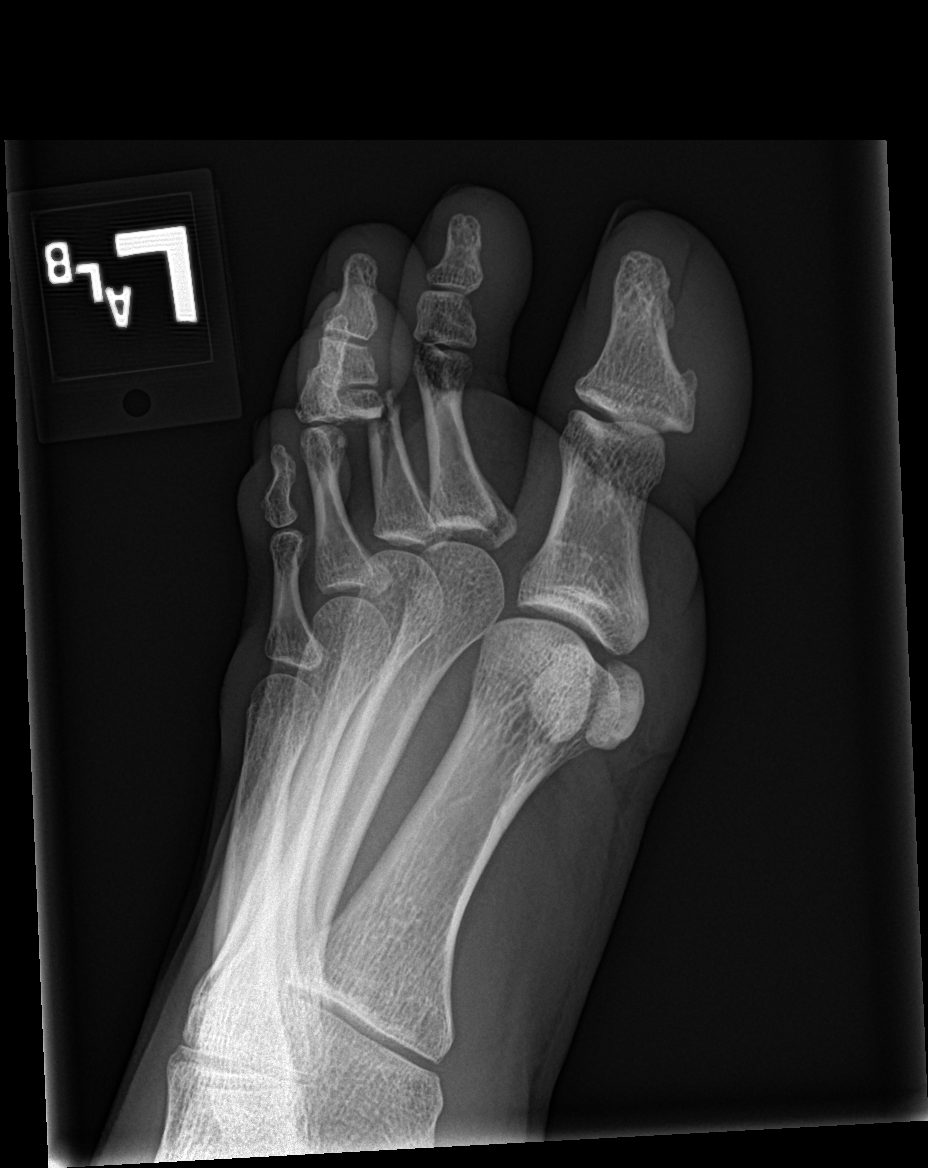

[4 of 4 positions shown; findings below may reference images not displayed]

FINDINGS: Mildly displaced transverse fracture involving the distal shaft of
the 3rd proximal phalanx.

The joint spaces are preserved.

Mild soft tissue swelling overlying the 5th MTP joint.
IMPRESSION: Fracture involving the distal shaft of the 3rd proximal phalanx, as
above.

## 2019-09-21 ENCOUNTER — Other Ambulatory Visit: Payer: Self-pay

## 2019-09-21 ENCOUNTER — Encounter: Payer: Self-pay | Admitting: Dermatology

## 2019-09-21 ENCOUNTER — Ambulatory Visit (INDEPENDENT_AMBULATORY_CARE_PROVIDER_SITE_OTHER): Payer: 59 | Admitting: Dermatology

## 2019-09-21 DIAGNOSIS — Z1283 Encounter for screening for malignant neoplasm of skin: Secondary | ICD-10-CM

## 2019-09-21 DIAGNOSIS — D229 Melanocytic nevi, unspecified: Secondary | ICD-10-CM

## 2019-09-21 DIAGNOSIS — L821 Other seborrheic keratosis: Secondary | ICD-10-CM

## 2019-09-21 DIAGNOSIS — D1800 Hemangioma unspecified site: Secondary | ICD-10-CM

## 2019-09-21 DIAGNOSIS — L820 Inflamed seborrheic keratosis: Secondary | ICD-10-CM

## 2019-09-21 DIAGNOSIS — L578 Other skin changes due to chronic exposure to nonionizing radiation: Secondary | ICD-10-CM

## 2019-09-21 DIAGNOSIS — D225 Melanocytic nevi of trunk: Secondary | ICD-10-CM

## 2019-09-21 DIAGNOSIS — L814 Other melanin hyperpigmentation: Secondary | ICD-10-CM

## 2019-09-21 DIAGNOSIS — Z86018 Personal history of other benign neoplasm: Secondary | ICD-10-CM

## 2019-09-21 NOTE — Progress Notes (Addendum)
   Follow-Up Visit   Subjective  April Everett is a 44 y.o. female who presents for the following: Annual Exam (TBSE Hx of Dysplastic nevus, last TBSE was years ago ). The patient presents for Total-Body Skin Exam (TBSE) for skin cancer screening and mole check.  The following portions of the chart were reviewed this encounter and updated as appropriate:  Tobacco  Allergies  Meds  Problems  Med Hx  Surg Hx  Fam Hx     Review of Systems:  No other skin or systemic complaints except as noted in HPI or Assessment and Plan.  Objective  Well appearing patient in no apparent distress; mood and affect are within normal limits.  A full examination was performed including scalp, head, eyes, ears, nose, lips, neck, chest, axillae, abdomen, back, buttocks, bilateral upper extremities, bilateral lower extremities, hands, feet, fingers, toes, fingernails, and toenails. All findings within normal limits unless otherwise noted below.  Objective  multiple see history: Scar with no evidence of recurrence.   Objective  Right Upper Back at braline: Tan-brown and/or pink-flesh-colored symmetric macules and papules.   Objective  R cheek, L zygoma (2): Erythematous keratotic or waxy stuck-on papule or plaque.    Assessment & Plan    Lentigines - Scattered tan macules - Discussed due to sun exposure - Benign, observe - Call for any changes  Seborrheic Keratoses - Stuck-on, waxy, tan-brown papules and plaques  - Discussed benign etiology and prognosis. - Observe - Call for any changes  Melanocytic Nevi - Tan-brown and/or pink-flesh-colored symmetric macules and papules - Benign appearing on exam today - Observation - Call clinic for new or changing moles - Recommend daily use of broad spectrum spf 30+ sunscreen to sun-exposed areas.   Hemangiomas - Red papules - Discussed benign nature - Observe - Call for any changes  Actinic Damage - diffuse scaly erythematous macules  with underlying dyspigmentation - Recommend daily broad spectrum sunscreen SPF 30+ to sun-exposed areas, reapply every 2 hours as needed.  - Call for new or changing lesions.  Skin cancer screening performed today.  History of dysplastic nevus multiple see history Clear. Observe for recurrence. Call clinic for new or changing lesions.  Recommend regular skin exams, daily broad-spectrum spf 30+ sunscreen use, and photoprotection.     Nevus Right Upper Back at braline Irritated nevus, possibly related to  insect bite  If no better in 4 weeks RTC we may consider biopsy   Inflamed seborrheic keratosis (2) R cheek, L zygoma  Destruction of lesion - R cheek, L zygoma Complexity: simple   Destruction method: cryotherapy   Informed consent: discussed and consent obtained   Timeout:  patient name, date of birth, surgical site, and procedure verified Lesion destroyed using liquid nitrogen: Yes   Region frozen until ice ball extended beyond lesion: Yes   Outcome: patient tolerated procedure well with no complications   Post-procedure details: wound care instructions given    Return in about 6 months (around 03/20/2020).  IMarye Round, CMA, am acting as scribe for Sarina Ser, MD .  Documentation: I have reviewed the above documentation for accuracy and completeness, and I agree with the above.  Sarina Ser, MD

## 2019-09-24 ENCOUNTER — Encounter: Payer: Self-pay | Admitting: Dermatology

## 2019-09-25 NOTE — Addendum Note (Signed)
Addended by: Ralene Bathe on: 09/25/2019 09:30 AM   Modules accepted: Level of Service

## 2020-03-21 ENCOUNTER — Ambulatory Visit: Payer: 59 | Admitting: Dermatology

## 2020-08-05 ENCOUNTER — Other Ambulatory Visit: Payer: Self-pay | Admitting: Neurology

## 2020-08-05 DIAGNOSIS — R2990 Unspecified symptoms and signs involving the nervous system: Secondary | ICD-10-CM

## 2020-08-10 ENCOUNTER — Ambulatory Visit: Payer: 59

## 2020-08-10 ENCOUNTER — Ambulatory Visit: Admission: RE | Admit: 2020-08-10 | Payer: 59 | Source: Ambulatory Visit

## 2020-11-05 ENCOUNTER — Emergency Department: Payer: 59

## 2020-11-05 ENCOUNTER — Other Ambulatory Visit: Payer: Self-pay

## 2020-11-05 ENCOUNTER — Emergency Department
Admission: EM | Admit: 2020-11-05 | Discharge: 2020-11-05 | Disposition: A | Discharge status: Home / Self Care | Payer: 59 | Attending: Student in an Organized Health Care Education/Training Program | Admitting: Student in an Organized Health Care Education/Training Program

## 2020-11-05 DIAGNOSIS — F419 Anxiety disorder, unspecified: Secondary | ICD-10-CM | POA: Insufficient documentation

## 2020-11-05 DIAGNOSIS — R002 Palpitations: Secondary | ICD-10-CM | POA: Diagnosis not present

## 2020-11-05 LAB — CBC
HCT: 43.6 % (ref 36.0–46.0)
Hemoglobin: 15.1 g/dL — ABNORMAL HIGH (ref 12.0–15.0)
MCH: 31.7 pg (ref 26.0–34.0)
MCHC: 34.6 g/dL (ref 30.0–36.0)
MCV: 91.6 fL (ref 80.0–100.0)
Platelets: 345 10*3/uL (ref 150–400)
RBC: 4.76 MIL/uL (ref 3.87–5.11)
RDW: 12.3 % (ref 11.5–15.5)
WBC: 7.1 10*3/uL (ref 4.0–10.5)
nRBC: 0 % (ref 0.0–0.2)

## 2020-11-05 LAB — BASIC METABOLIC PANEL
Anion gap: 7 (ref 5–15)
BUN: 11 mg/dL (ref 6–20)
CO2: 24 mmol/L (ref 22–32)
Calcium: 9.5 mg/dL (ref 8.9–10.3)
Chloride: 106 mmol/L (ref 98–111)
Creatinine, Ser: 0.7 mg/dL (ref 0.44–1.00)
GFR, Estimated: >60 mL/min (ref >60)
Glucose, Bld: 102 mg/dL — ABNORMAL HIGH (ref 70–99)
Potassium: 3.7 mmol/L (ref 3.5–5.1)
Sodium: 137 mmol/L (ref 135–145)

## 2020-11-05 LAB — TROPONIN I (HIGH SENSITIVITY)
Troponin I (High Sensitivity): <2 ng/L (ref <18)
Troponin I (High Sensitivity): 3 ng/L (ref <18)

## 2020-11-05 LAB — POC URINE PREG, ED: Preg Test, Ur: NEGATIVE

## 2020-11-05 NOTE — ED Notes (Signed)
Pt is refusing xray at this time until she sees an EDP

## 2020-11-05 NOTE — ED Triage Notes (Signed)
Pt states she was sitting in the car riding lot and had sudden onset heart palpitations with SOB, tongue and hands went numb with "foggy vision". Pt states she does have a hx of panic attacks

## 2020-11-05 NOTE — ED Provider Notes (Signed)
[facility] Emergency Department Provider Note    Event Date/Time   First MD Initiated Contact with Patient 11/05/20 1832     (approximate)  I have reviewed the triage vital signs and the nursing notes.   HISTORY  Chief Complaint Palpitations    HPI April Everett is a 45 y.o. female below listed past medical history presents to the ER for evaluation of palpitations and overwhelming sense of stress when she was in the car during pick her children.  Did not have any loss of consciousness.  Was having palpitations associated with some shortness of breath.  And states that she felt like she was having a panic attack was more severe 1.  Has dealt with increasing stress and anxiety over the past several months.  She is currently being worked up by neurology for episodes of tingling sensation and weakness as well as muscle cramps had an MRI a year ago that was reassuring has follow-up with neurology on Thursday.  States that she is having lots of brain fog blurry vision feeling very anxious and tearful.  Is never been diagnosed with depression or anxiety.  She currently denies any chest pain or pressure no shortness of breath.  Past Medical History:  Diagnosis Date  . Atypical mole 2019   Left medial scapula   . Atypical mole 2019   Left low back   . Ocular migraine   . Palpitations    No family history on file. Past Surgical History:  Procedure Laterality Date  . CLOSED REDUCTION METATARSAL Left 11/14/2015   Procedure: CLOSED REDUCTION WITH PINNING OF LEFT THIRD TOE;  Surgeon: Albertine Patricia, DPM;  Location: [facility];  Service: Podiatry;  Laterality: Left;  . NO PAST SURGERIES     There are no problems to display for this patient.     Prior to Admission medications   Medication Sig Start Date End Date Taking? Authorizing Provider  Docusate Calcium (STOOL SOFTENER PO) Take by mouth.    [provider]  HYDROcodone-acetaminophen  (NORCO) 5-325 MG tablet Take 1 tablet by mouth every 6 (six) hours as needed for moderate pain. 11/14/15   Albertine Patricia, DPM    Allergies Erythromycin    Social History Social History   Tobacco Use  . Smoking status: Never  . Smokeless tobacco: Never  . Tobacco comments:    smoked some as teenager  Substance Use Topics  . Alcohol use: Yes    Comment: Holidays  . Drug use: No    Review of Systems Patient denies headaches, rhinorrhea, blurry vision, numbness, shortness of breath, chest pain, edema, cough, abdominal pain, nausea, vomiting, diarrhea, dysuria, fevers, rashes or hallucinations unless otherwise stated above in HPI. ____________________________________________   PHYSICAL EXAM:  VITAL SIGNS: Vitals:   11/05/20 1719 11/05/20 1921  BP: 107/78 104/68  Pulse: 88 82  Resp:  15  Temp: 98.6 F (37 C) 98.6 F (37 C)  SpO2: 100% 98%    Constitutional: Alert and oriented.  Eyes: Conjunctivae are normal.  Head: Atraumatic. Nose: No congestion/rhinnorhea. Mouth/Throat: Mucous membranes are moist.   Neck: No stridor. Painless ROM.  Cardiovascular: Normal rate, regular rhythm. Grossly normal heart sounds.  Good peripheral circulation. Respiratory: Normal respiratory effort.  No retractions. Lungs CTAB. Gastrointestinal: Soft and nontender. No distention. No abdominal bruits. No CVA tenderness. Genitourinary:  Musculoskeletal: No lower extremity tenderness nor edema.  No joint effusions. Neurologic:  Normal speech and language. No gross focal neurologic deficits are appreciated.  No facial droop Skin:  Skin is warm, dry and intact. No rash noted. Psychiatric: tearful, Speech and behavior are normal.  ____________________________________________   LABS (all labs ordered are listed, but only abnormal results are displayed)  Results for orders placed or performed during the hospital encounter of 11/05/20 (from the past 24 hour(s))  Basic metabolic panel      Status: Abnormal   Collection Time: 11/05/20  3:45 PM  Result Value Ref Range   Sodium 137 135 - 145 mmol/L   Potassium 3.7 3.5 - 5.1 mmol/L   Chloride 106 98 - 111 mmol/L   CO2 24 22 - 32 mmol/L   Glucose, Bld 102 (H) 70 - 99 mg/dL   BUN 11 6 - 20 mg/dL   Creatinine, Ser 0.70 0.44 - 1.00 mg/dL   Calcium 9.5 8.9 - 10.3 mg/dL   GFR, Estimated >60 >60 mL/min   Anion gap 7 5 - 15  CBC     Status: Abnormal   Collection Time: 11/05/20  3:45 PM  Result Value Ref Range   WBC 7.1 4.0 - 10.5 K/uL   RBC 4.76 3.87 - 5.11 MIL/uL   Hemoglobin 15.1 (H) 12.0 - 15.0 g/dL   HCT 43.6 36.0 - 46.0 %   MCV 91.6 80.0 - 100.0 fL   MCH 31.7 26.0 - 34.0 pg   MCHC 34.6 30.0 - 36.0 g/dL   RDW 12.3 11.5 - 15.5 %   Platelets 345 150 - 400 K/uL   nRBC 0.0 0.0 - 0.2 %  Troponin I (High Sensitivity)     Status: None   Collection Time: 11/05/20  3:45 PM  Result Value Ref Range   Troponin I (High Sensitivity) <2 <18 ng/L  POC urine preg, ED     Status: None   Collection Time: 11/05/20  4:13 PM  Result Value Ref Range   Preg Test, Ur NEGATIVE NEGATIVE  Troponin I (High Sensitivity)     Status: None   Collection Time: 11/05/20  5:13 PM  Result Value Ref Range   Troponin I (High Sensitivity) 3 <18 ng/L   ____________________________________________  EKG My review and personal interpretation at Time: 15:54   Indication: palpitations  Rate: 100  Rhythm: sinus Axis: normal Other: nonspecific t wave abn, no stemi or depression, no preexcitation  ____________________________________________  RADIOLOGY   ____________________________________________   PROCEDURES  Procedure(s) performed:  Procedures    Critical Care performed: no ____________________________________________   INITIAL IMPRESSION / ASSESSMENT AND PLAN / ED COURSE  Pertinent labs & imaging results that were available during my care of the patient were reviewed by me and considered in my medical decision making (see chart for  details).   DDX: dysrhythmia, electroylte abn, ms, cvst, seizure, acs  April Everett is a 45 y.o. who presents to the ED with presentation as described above.  Patient clinically anxious appearing but in no acute distress.  Patient with several months of persistent brain fog and neuro symptoms being worked up as by neurology.  She feels like today's episode was more related to panic attack.  Her blood work is reassuring her EKG is nonischemic.  Is not consistent with ACS.  Not consistent with PE or dissection.  Blood work is otherwise reassuring.  She does not have any focal neurodeficits at this time.  I did recommend MRI of the brain for further work-up and evaluation given her symptoms.  They have follow-up with neurology on Thursday however would prefer to follow-up with him  to discuss additional imaging and testing.  Given the duration of her symptoms over the past several months I think that that is reasonable as there does not appear to be any acute changes.  She does seem to be very anxious she is not describing any SI or HI.  Will give referral for PCP as well as outpatient resources.  Patient and family at bedside are agreeable to plan.     The patient was evaluated in Emergency Department today for the symptoms described in the history of present illness. He/she was evaluated in the context of the global COVID-19 pandemic, which necessitated consideration that the patient might be at risk for infection with the SARS-CoV-2 virus that causes COVID-19. Institutional protocols and algorithms that pertain to the evaluation of patients at risk for COVID-19 are in a state of rapid change based on information released by regulatory bodies including the CDC and federal and state organizations. These policies and algorithms were followed during the patient's care in the ED.  As part of my medical decision making, I reviewed the following data within the [facility] notes  reviewed and incorporated, Labs reviewed, notes from prior ED visits and [place] Controlled Substance Database   ____________________________________________   FINAL CLINICAL IMPRESSION(S) / ED DIAGNOSES  Final diagnoses:  Palpitations  Anxiety      NEW MEDICATIONS STARTED DURING THIS VISIT:  Discharge Medication List as of 11/05/2020  7:12 PM       Note:  This document was prepared using Dragon voice recognition software and may include unintentional dictation errors.    Merlyn Lot, MD 11/05/20 [phone]

## 2020-11-13 ENCOUNTER — Ambulatory Visit: Admission: RE | Admit: 2020-11-13 | Payer: 59 | Source: Ambulatory Visit

## 2023-08-27 ENCOUNTER — Other Ambulatory Visit: Payer: Self-pay | Admitting: Emergency Medicine

## 2023-08-27 DIAGNOSIS — Z1231 Encounter for screening mammogram for malignant neoplasm of breast: Secondary | ICD-10-CM

## 2023-09-16 ENCOUNTER — Encounter
